# Patient Record
Sex: Male | Born: 2000 | Race: White | Hispanic: No | Marital: Single | Smoking: Current every day smoker
Health system: Southern US, Community
[De-identification: ages and names within clinical notes are randomized; demographics above are authoritative.]

## PROBLEM LIST (undated history)

## (undated) DIAGNOSIS — F909 Attention-deficit hyperactivity disorder, unspecified type: Secondary | ICD-10-CM

## (undated) HISTORY — PX: TONSILLECTOMY: SUR1361

## (undated) HISTORY — PX: MYRINGOTOMY WITH TUBE PLACEMENT: SHX5663

---
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
## 2018-01-02 ENCOUNTER — Emergency Department (INDEPENDENT_AMBULATORY_CARE_PROVIDER_SITE_OTHER)
Admission: EM | Admit: 2018-01-02 | Discharge: 2018-01-02 | Disposition: A | Discharge status: Home / Self Care | Payer: Medicaid Other | Source: Home / Self Care | Attending: Emergency Medicine | Admitting: Emergency Medicine

## 2018-01-02 ENCOUNTER — Encounter: Payer: Self-pay | Admitting: Emergency Medicine

## 2018-01-02 DIAGNOSIS — J111 Influenza due to unidentified influenza virus with other respiratory manifestations: Secondary | ICD-10-CM | POA: Diagnosis not present

## 2018-01-02 MED ORDER — OSELTAMIVIR PHOSPHATE 75 MG PO CAPS: ORAL_CAPSULE | ORAL | 0 refills | Status: AC

## 2018-01-02 NOTE — ED Triage Notes (Signed)
Pt c/o HA, fever of 101.2 and shaking that started this am. States girlfriend was dx with flu yesterday.

## 2018-01-02 NOTE — Discharge Instructions (Addendum)
Please read attached influenza instruction sheet. You need to rest, drink fluids, Tylenol or ibuprofen for pain or fever Tamiflu twice a day for 5 days.  Start taking as soon as possible. A return to school and work on Monday if no fever for 24 hours. Follow-up with your doctor if not improving in 3-4 days, sooner if worse or new symptoms.

## 2018-01-02 NOTE — ED Provider Notes (Signed)
[facility]    CSN: [id number] Arrival date & time: 01/02/18  1129     History   Chief Complaint Chief Complaint  Patient presents with  . Headache    HPI Scott Simon is a 17 y.o. male.   HPI   HPI : Acute Flu symptoms for  1 day. Fever to 101.4 with chills, sweats, myalgias, fatigue, headache. Symptoms are progressively worsening, despite trying OTC fever reducing medicine and rest and fluids. Has decreased appetite, but tolerating some liquids by mouth. No history of recent tick bite.  Review of Systems: Positive for fatigue, mild nasal congestion, mild sore throat, mild swollen anterior neck glands, mild cough. Negative for acute vision changes, stiff neck, focal weakness, syncope, seizures, respiratory distress, vomiting, diarrhea, GU symptoms, new rash.  Past Medical History:  Diagnosis Date  . ADHD     There are no active problems to display for this patient.   Past Surgical History:  Procedure Laterality Date  . MYRINGOTOMY WITH TUBE PLACEMENT    . TONSILLECTOMY         Home Medications    Prior to Admission medications   Medication Sig Start Date End Date Taking? Authorizing Provider  penicillin v potassium (VEETID) 500 MG tablet Take 500 mg by mouth 4 (four) times daily.   Yes [provider]  lisdexamfetamine (VYVANSE) 20 MG capsule Take 20 mg by mouth daily.    [provider]  oseltamivir (TAMIFLU) 75 MG capsule Starting today, take 1 capsule by mouth twice a day for 5 days. 01/02/18   Lajean ManesMassey, David, MD    Family History History reviewed. No pertinent family history.  Social History Social History   Tobacco Use  . Smoking status: Never Smoker  . Smokeless tobacco: Never Used  Substance Use Topics  . Alcohol use: No    Frequency: Never  . Drug use: No     Allergies   Patient has no known allergies.   Review of Systems Review of Systems See HPI  Physical Exam Triage Vital Signs ED Triage Vitals    Enc Vitals Group     BP 01/02/18 1148 140/89     Pulse Rate 01/02/18 1148 (!) 130     Resp --      Temp 01/02/18 1148 (!) 101.2 F (38.4 C)     Temp Source 01/02/18 1148 Oral     SpO2 01/02/18 1148 98 %     Weight 01/02/18 1150 216 lb (98 kg)     Height --      Head Circumference --      Peak Flow --      Pain Score 01/02/18 1150 0     Pain Loc --      Pain Edu? --      Excl. in GC? --    No data found.  Updated Vital Signs BP 140/89 (BP Location: Right Arm)   Pulse (!) 130   Temp (!) 101.2 F (38.4 C) (Oral)   Wt 216 lb (98 kg)   SpO2 98%   BMI 33.09 kg/m    Physical Exam  Physical Exam:   Nursing note and vitals reviewed. Constitutional: Appears well-developed and well-nourished. No distress. Non-toxic appearance.  Appears ill, fatigued, but no cardiorespiratory distress     HENT:  Head: Normocephalic and atraumatic.  Right Ear: Tympanic membrane and external ear normal.  Left Ear: Tympanic membrane and external ear normal.  Nose:  mild rhinorrhea Mouth/Throat:   Mucous membranes are  normal.   Posterior pharynx mildly red.  No oropharyngeal exudate.   Eyes:   Conjunctivae are normal. No scleral icterus.   Right eye ,no discharge.   Left eye ,no discharge.   Neck:   Supple.   Shotty, mobile anterior cervical nodes present.  Heart:  normal rate, regular rhythm and normal heart sounds.   Lungs:    Breath sounds normal. No stridor. No respiratory distress.   No wheezes. No rales.   Abdominal: Soft. There is no tenderness.   Musculoskeletal: No edema.   Neurological:   Alert.    Cranial nerves intact.  Skin:   Warm and intact. No rash noted.    Diaphoretic  Psychiatric: Normal mood and affect.   UC Treatments / Results  Labs (all labs ordered are listed, but only abnormal results are displayed) Labs Reviewed - No data to display  EKG  EKG Interpretation None       Radiology No results found.  Procedures Procedures (including  critical care time)  Medications Ordered in UC Medications - No data to display   Initial Impression / Assessment and Plan / UC Course  I have reviewed the triage vital signs and the nursing notes.  Pertinent labs & imaging results that were available during my care of the patient were reviewed by me and considered in my medical decision making (see chart for details).     Treatment options discussed, as well as risks, benefits, alternatives. Patient voiced understanding and agreement with the following plans: As he has classical signs and symptoms of influenza, it was not needed to do flu test, which can have false negatives.  Because we are in the midst of a flu epidemic, will treat with Tamiflu.  75 twice daily times 5 days   Final Clinical Impressions(s) / UC Diagnoses   Final diagnoses:  Influenza    ED Discharge Orders        Ordered    oseltamivir (TAMIFLU) 75 MG capsule     01/02/18 1210     Please read attached influenza instruction sheet. You need to rest, drink fluids, Tylenol or ibuprofen for pain or fever Tamiflu twice a day for 5 days.  Start taking as soon as possible. May return to school and work on Monday if no fever for 24 hours.  Follow-up with your primary care doctor in 5-7 days if not improving, or sooner if symptoms become worse. Precautions discussed. Red flags discussed. Questions invited and answered. Patient voiced understanding and agreement.    Controlled Substance Prescriptions [place] Controlled Substance Registry consulted? Not Applicable   Lajean Manes, MD 01/02/18 1229

## 2018-01-14 ENCOUNTER — Other Ambulatory Visit: Payer: Self-pay

## 2018-01-14 ENCOUNTER — Emergency Department (INDEPENDENT_AMBULATORY_CARE_PROVIDER_SITE_OTHER)
Admission: EM | Admit: 2018-01-14 | Discharge: 2018-01-14 | Disposition: A | Discharge status: Home / Self Care | Payer: Medicaid Other | Source: Home / Self Care

## 2018-01-14 DIAGNOSIS — J029 Acute pharyngitis, unspecified: Secondary | ICD-10-CM | POA: Diagnosis not present

## 2018-01-14 LAB — POCT RAPID STREP A (OFFICE): RAPID STREP A SCREEN: NEGATIVE

## 2018-01-14 MED ORDER — AMOXICILLIN 500 MG PO CAPS: 500.0000 mg | ORAL_CAPSULE | Freq: Three times a day (TID) | ORAL | 0 refills | Status: AC

## 2018-01-14 NOTE — Discharge Instructions (Signed)
Return if any problems.

## 2018-01-14 NOTE — ED Provider Notes (Signed)
[facility]    CSN: [id number] Arrival date & time: 01/14/18  1239     History   Chief Complaint Chief Complaint  Patient presents with  . Cough  . Otalgia  . Nasal Congestion  . Sore Throat    HPI Scott Simon is a 17 y.o. male.   The history is provided by the patient. No language interpreter was used.  Cough  Cough characteristics:  Non-productive Sputum characteristics:  Nondescript Severity:  Moderate Onset quality:  Gradual Timing:  Constant Progression:  Worsening Chronicity:  New Smoker: yes   Relieved by:  Nothing Worsened by:  Nothing Ineffective treatments:  None tried Associated symptoms: ear pain   Risk factors: no recent travel   Otalgia  Associated symptoms: cough   Sore Throat    Pt complains of an ear ache and sore throat Past Medical History:  Diagnosis Date  . ADHD     There are no active problems to display for this patient.   Past Surgical History:  Procedure Laterality Date  . MYRINGOTOMY WITH TUBE PLACEMENT    . TONSILLECTOMY         Home Medications    Prior to Admission medications   Medication Sig Start Date End Date Taking? Authorizing Provider  amoxicillin (AMOXIL) 500 MG capsule Take 1 capsule (500 mg total) by mouth 3 (three) times daily. 01/14/18   Elson Areas, PA-C  lisdexamfetamine (VYVANSE) 20 MG capsule Take 20 mg by mouth daily.    [provider]  oseltamivir (TAMIFLU) 75 MG capsule Starting today, take 1 capsule by mouth twice a day for 5 days. 01/02/18   Lajean Manes, MD  penicillin v potassium (VEETID) 500 MG tablet Take 500 mg by mouth 4 (four) times daily.    [provider]    Family History History reviewed. No pertinent family history.  Social History Social History   Tobacco Use  . Smoking status: Current Every Day Smoker    Types: E-cigarettes  . Smokeless tobacco: Never Used  Substance Use Topics  . Alcohol use: No    Frequency: Never  . Drug use: No      Allergies   Patient has no known allergies.   Review of Systems Review of Systems  HENT: Positive for ear pain.   Respiratory: Positive for cough.   All other systems reviewed and are negative.    Physical Exam Triage Vital Signs ED Triage Vitals  Enc Vitals Group     BP 01/14/18 1310 119/76     Pulse Rate 01/14/18 1310 (!) 114     Resp --      Temp 01/14/18 1310 98.3 F (36.8 C)     Temp Source 01/14/18 1310 Oral     SpO2 01/14/18 1310 99 %     Weight 01/14/18 1311 215 lb (97.5 kg)     Height 01/14/18 1311 5\' 8"  (1.727 m)     Head Circumference --      Peak Flow --      Pain Score 01/14/18 1311 0     Pain Loc --      Pain Edu? --      Excl. in GC? --    No data found.  Updated Vital Signs BP 119/76 (BP Location: Right Arm)   Pulse (!) 114   Temp 98.3 F (36.8 C) (Oral)   Ht 5\' 8"  (1.727 m)   Wt 215 lb (97.5 kg)   SpO2 99%   BMI 32.69  kg/m   Visual Acuity Right Eye Distance:   Left Eye Distance:   Bilateral Distance:    Right Eye Near:   Left Eye Near:    Bilateral Near:     Physical Exam  Constitutional: He appears well-developed and well-nourished.  HENT:  Head: Normocephalic and atraumatic.  Right Ear: Tympanic membrane normal.  Left Ear: Tympanic membrane normal.  Mouth/Throat: Mucous membranes are normal. Posterior oropharyngeal edema present.  Eyes: Pupils are equal, round, and reactive to light. Conjunctivae are normal.  Neck: Neck supple.  Cardiovascular: Normal rate and regular rhythm.  No murmur heard. Pulmonary/Chest: Effort normal and breath sounds normal. No respiratory distress.  Abdominal: Soft. There is no tenderness.  Musculoskeletal: He exhibits no edema.  Neurological: He is alert.  Skin: Skin is warm and dry.  Psychiatric: He has a normal mood and affect.  Nursing note and vitals reviewed.    UC Treatments / Results  Labs (all labs ordered are listed, but only abnormal results are displayed) Labs Reviewed  POCT  RAPID STREP A (OFFICE)    EKG None Radiology No results found.  Procedures Procedures (including critical care time)  Medications Ordered in UC Medications - No data to display   Initial Impression / Assessment and Plan / UC Course  I have reviewed the triage vital signs and the nursing notes.  Pertinent labs & imaging results that were available during my care of the patient were reviewed by me and considered in my medical decision making (see chart for details).     An After Visit Summary was printed and given to the patient.   Final Clinical Impressions(s) / UC Diagnoses   Final diagnoses:  Pharyngitis, unspecified etiology    ED Discharge Orders        Ordered    amoxicillin (AMOXIL) 500 MG capsule  3 times daily     01/14/18 1345       Controlled Substance Prescriptions [place] Controlled Substance Registry consulted? Not Applicable   Elson AreasSofia, Dunbar Buras K, [place]PA-C 01/14/18 1418

## 2018-01-14 NOTE — ED Triage Notes (Signed)
Last week was dx with the flu.  Woke up vomiting yesterday morning, and weak all day.  Today feeling better, but throat is sore and both ears have pressure.

## 2019-05-10 IMAGING — DX DG LUMBAR SPINE COMPLETE 4+V
5 series · 5 of 5 positions shown · non-contrast
Comparison: None.

CLINICAL DATA: Fall with bruising and tenderness

EXAM:
LUMBAR SPINE - COMPLETE 4+ VIEW

[l-spine ap]
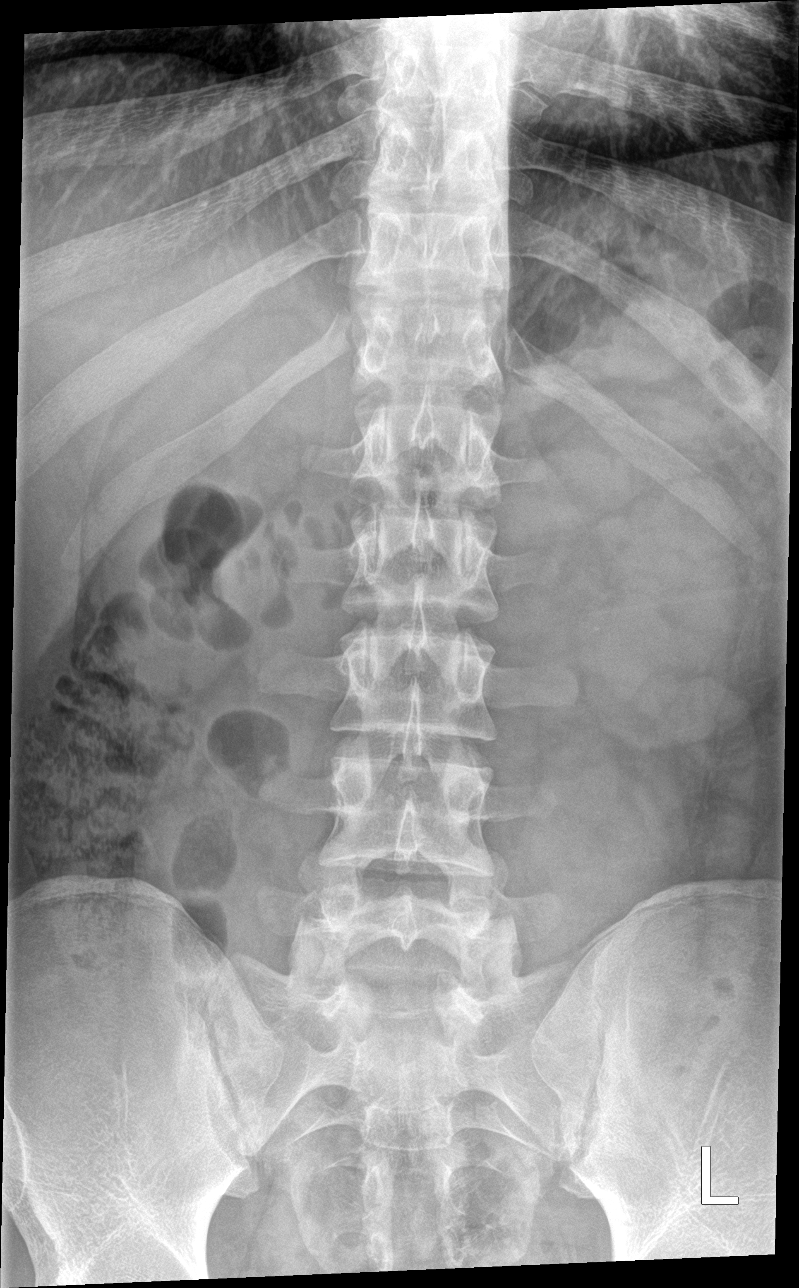

[l-spine obl (1 of 2)]
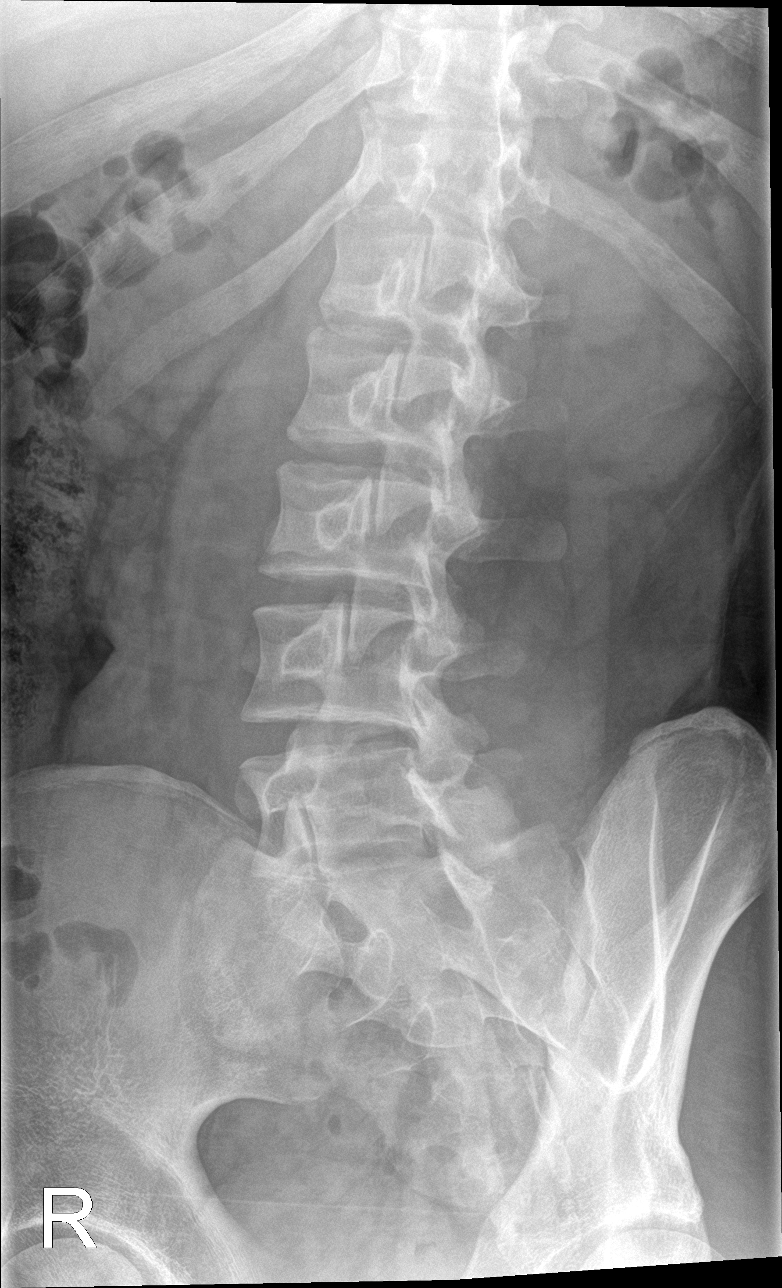

[l-spine obl (2 of 2)]
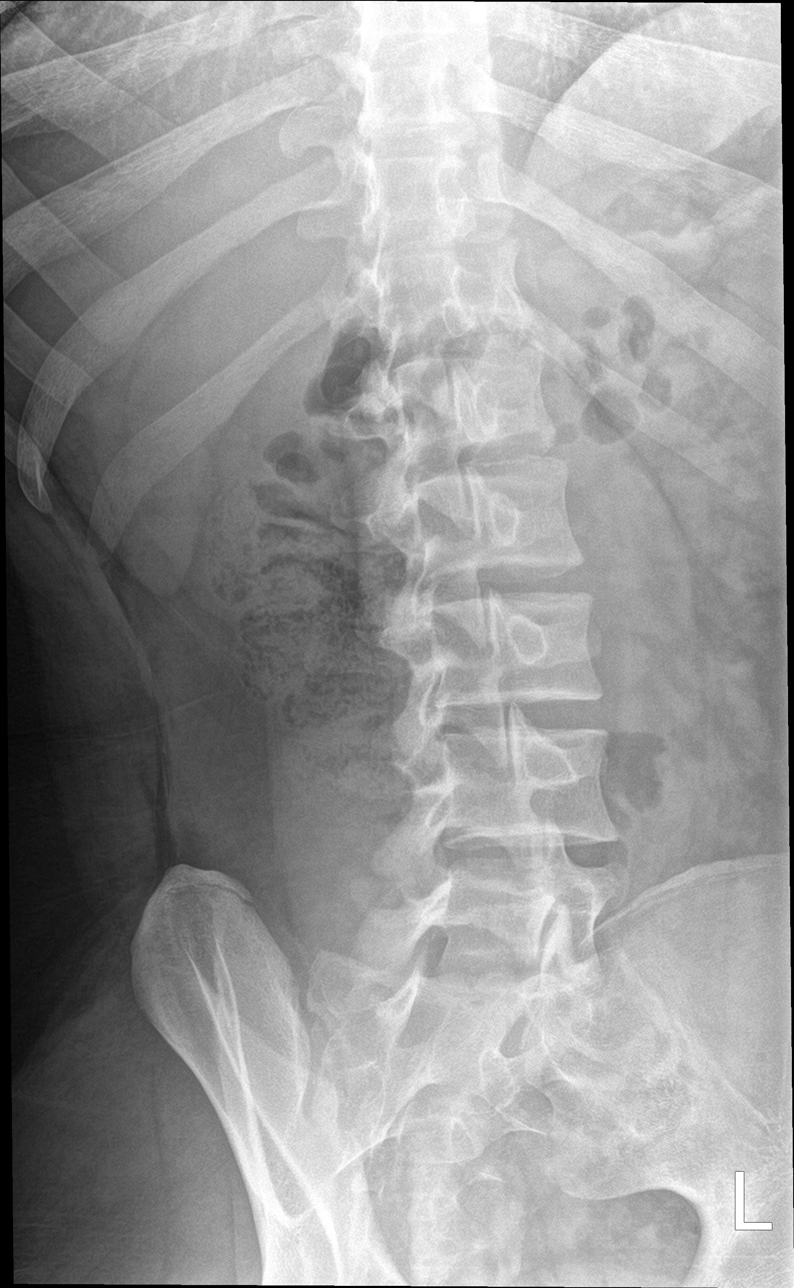

[l-spine lat]
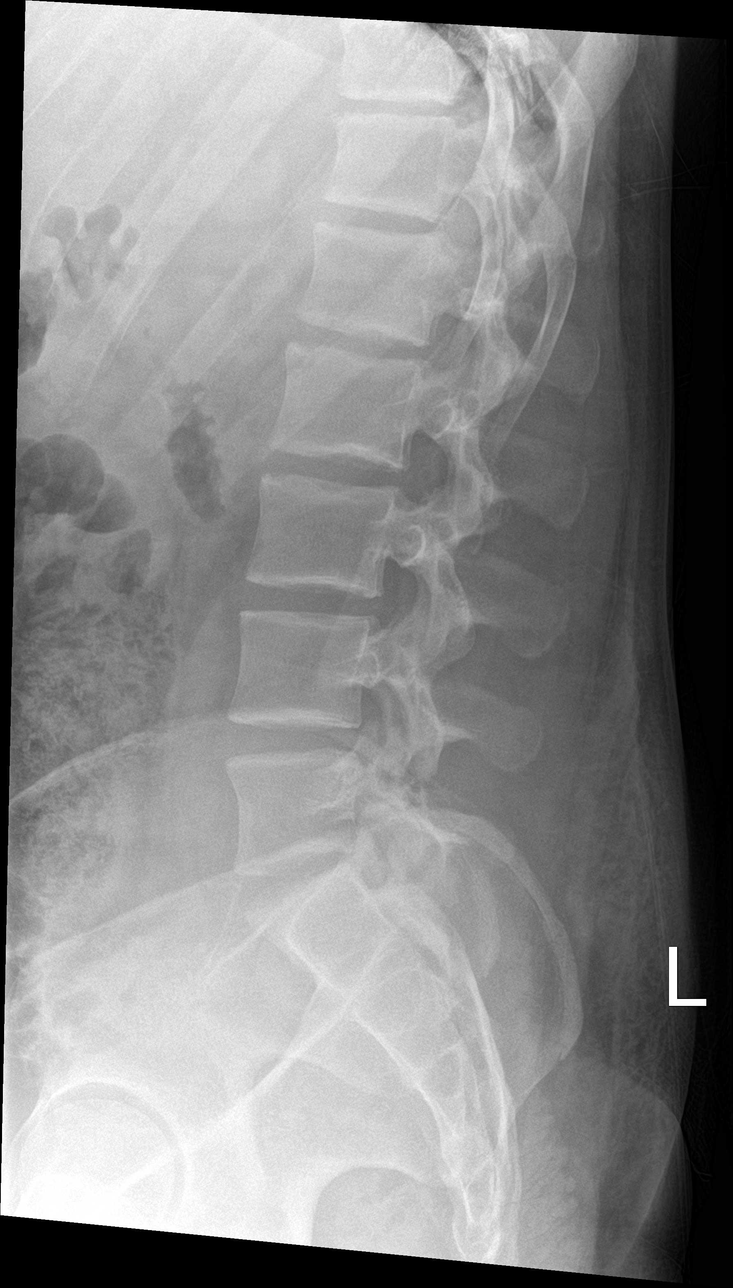

[l-spine spot]
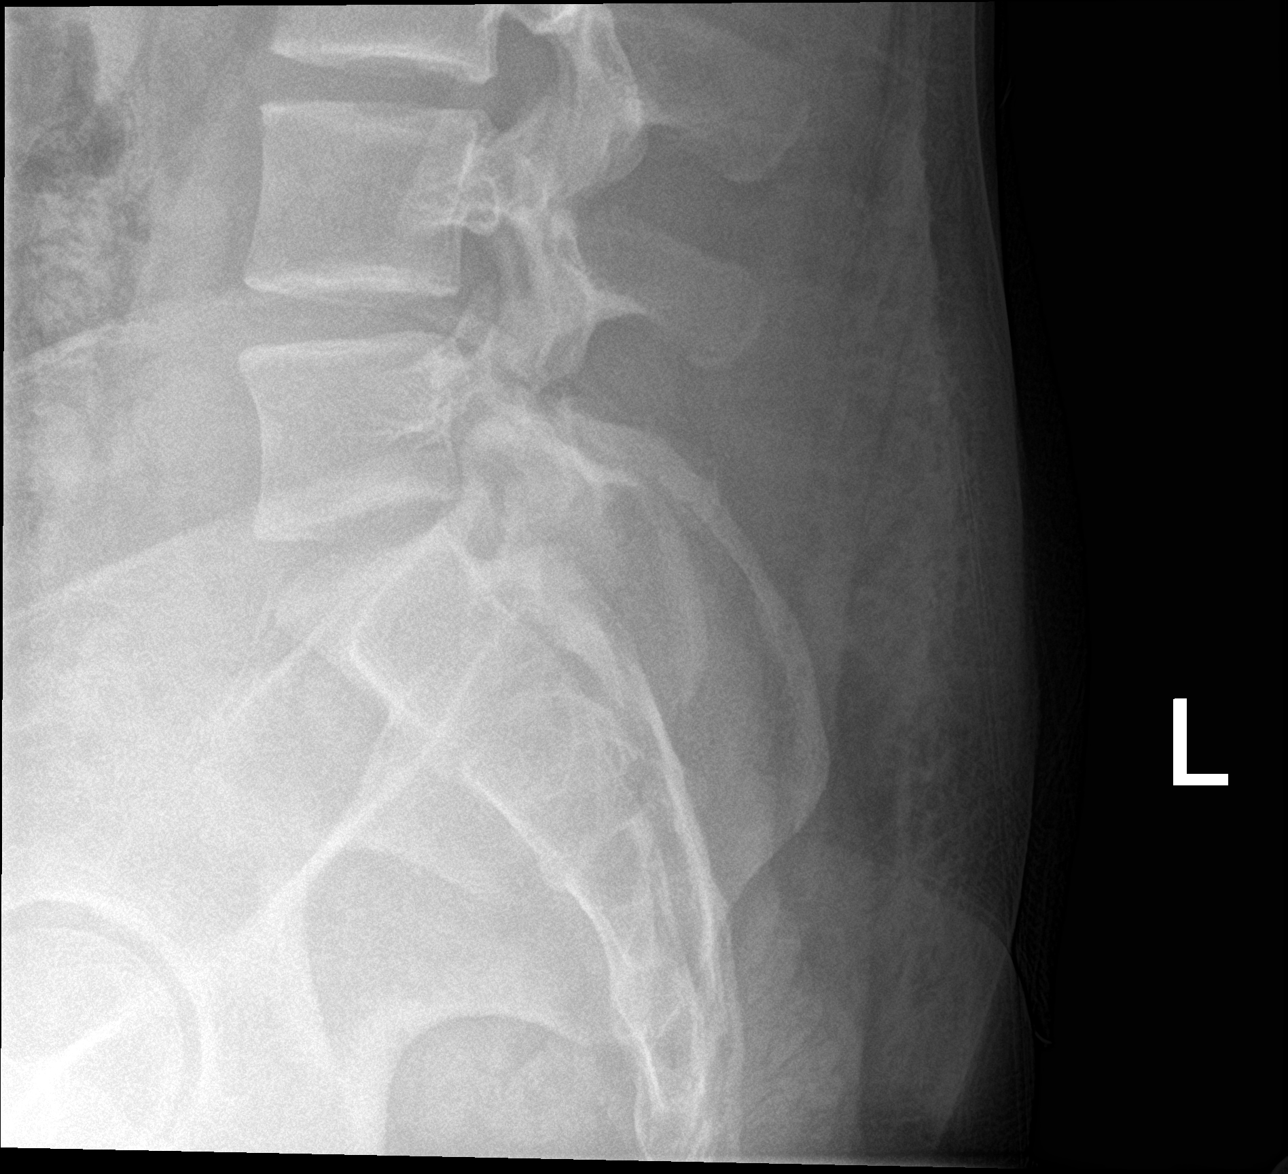

[5 of 5 positions shown; findings below may reference images not displayed]

FINDINGS: There is no evidence of lumbar spine fracture. Alignment is normal.
Intervertebral disc spaces are maintained. Soft tissue swelling
posterior to the spine.
IMPRESSION: No radiographic evidence for acute osseous abnormality
